# Patient Record
Sex: Female | Born: 1945 | Race: Black or African American | Hispanic: No
Health system: Southern US, Community
[De-identification: ages and names within clinical notes are randomized; demographics above are authoritative.]

## PROBLEM LIST (undated history)

## (undated) HISTORY — PX: BREAST CYST EXCISION: SHX579

---

## 2011-12-02 ENCOUNTER — Other Ambulatory Visit: Payer: Self-pay | Admitting: Family Medicine

## 2011-12-02 DIAGNOSIS — Z1231 Encounter for screening mammogram for malignant neoplasm of breast: Secondary | ICD-10-CM

## 2011-12-02 DIAGNOSIS — Z780 Asymptomatic menopausal state: Secondary | ICD-10-CM

## 2012-03-06 ENCOUNTER — Ambulatory Visit (HOSPITAL_COMMUNITY): Payer: Self-pay

## 2012-04-06 ENCOUNTER — Ambulatory Visit (HOSPITAL_COMMUNITY): Payer: Self-pay

## 2013-04-09 ENCOUNTER — Other Ambulatory Visit (HOSPITAL_COMMUNITY): Payer: Self-pay | Admitting: Family Medicine

## 2013-08-02 ENCOUNTER — Other Ambulatory Visit (HOSPITAL_COMMUNITY): Payer: Self-pay | Admitting: Family Medicine

## 2013-08-02 DIAGNOSIS — Z1231 Encounter for screening mammogram for malignant neoplasm of breast: Secondary | ICD-10-CM

## 2013-09-12 ENCOUNTER — Ambulatory Visit (HOSPITAL_COMMUNITY): Payer: Self-pay

## 2013-10-02 ENCOUNTER — Ambulatory Visit (HOSPITAL_COMMUNITY)
Admission: RE | Admit: 2013-10-02 | Discharge: 2013-10-02 | Disposition: A | Discharge status: Home / Self Care | Payer: Medicare Other | Source: Ambulatory Visit | Attending: Family Medicine | Admitting: Family Medicine

## 2013-10-02 DIAGNOSIS — Z1231 Encounter for screening mammogram for malignant neoplasm of breast: Secondary | ICD-10-CM | POA: Insufficient documentation

## 2014-04-18 ENCOUNTER — Other Ambulatory Visit (HOSPITAL_COMMUNITY): Payer: Self-pay | Admitting: Family Medicine

## 2014-04-18 DIAGNOSIS — E2839 Other primary ovarian failure: Secondary | ICD-10-CM

## 2014-09-08 ENCOUNTER — Other Ambulatory Visit (HOSPITAL_COMMUNITY): Payer: Self-pay | Admitting: Family Medicine

## 2014-09-08 DIAGNOSIS — Z1231 Encounter for screening mammogram for malignant neoplasm of breast: Secondary | ICD-10-CM

## 2014-10-07 ENCOUNTER — Ambulatory Visit (HOSPITAL_COMMUNITY)
Admission: RE | Admit: 2014-10-07 | Discharge: 2014-10-07 | Disposition: A | Discharge status: Home / Self Care | Payer: Medicare Other | Source: Ambulatory Visit | Attending: Family Medicine | Admitting: Family Medicine

## 2014-10-07 DIAGNOSIS — Z1231 Encounter for screening mammogram for malignant neoplasm of breast: Secondary | ICD-10-CM | POA: Insufficient documentation

## 2015-09-07 ENCOUNTER — Other Ambulatory Visit: Payer: Self-pay

## 2015-09-07 DIAGNOSIS — Z1231 Encounter for screening mammogram for malignant neoplasm of breast: Secondary | ICD-10-CM

## 2015-10-14 ENCOUNTER — Ambulatory Visit: Payer: Medicare Other

## 2015-10-27 ENCOUNTER — Ambulatory Visit: Payer: Medicare Other

## 2015-11-03 ENCOUNTER — Ambulatory Visit: Payer: Medicare Other

## 2015-12-01 ENCOUNTER — Ambulatory Visit
Admission: RE | Admit: 2015-12-01 | Discharge: 2015-12-01 | Disposition: A | Discharge status: Home / Self Care | Payer: Medicare Other | Source: Ambulatory Visit

## 2015-12-01 DIAGNOSIS — Z1231 Encounter for screening mammogram for malignant neoplasm of breast: Secondary | ICD-10-CM

## 2016-10-20 ENCOUNTER — Other Ambulatory Visit: Payer: Self-pay | Admitting: Family Medicine

## 2016-10-20 DIAGNOSIS — Z1231 Encounter for screening mammogram for malignant neoplasm of breast: Secondary | ICD-10-CM

## 2016-12-09 ENCOUNTER — Ambulatory Visit: Payer: Medicare Other

## 2016-12-20 ENCOUNTER — Ambulatory Visit
Admission: RE | Admit: 2016-12-20 | Discharge: 2016-12-20 | Disposition: A | Discharge status: Home / Self Care | Payer: Medicare Other | Source: Ambulatory Visit | Attending: Family Medicine | Admitting: Family Medicine

## 2016-12-20 DIAGNOSIS — Z1231 Encounter for screening mammogram for malignant neoplasm of breast: Secondary | ICD-10-CM

## 2017-11-10 ENCOUNTER — Other Ambulatory Visit: Payer: Self-pay | Admitting: Internal Medicine

## 2017-11-10 DIAGNOSIS — Z1231 Encounter for screening mammogram for malignant neoplasm of breast: Secondary | ICD-10-CM

## 2017-12-25 ENCOUNTER — Ambulatory Visit
Admission: RE | Admit: 2017-12-25 | Discharge: 2017-12-25 | Disposition: A | Discharge status: Home / Self Care | Payer: Medicare Other | Source: Ambulatory Visit | Attending: Internal Medicine | Admitting: Internal Medicine

## 2017-12-25 DIAGNOSIS — Z1231 Encounter for screening mammogram for malignant neoplasm of breast: Secondary | ICD-10-CM

## 2018-11-23 ENCOUNTER — Other Ambulatory Visit: Payer: Self-pay | Admitting: Family Medicine

## 2018-11-23 DIAGNOSIS — Z1231 Encounter for screening mammogram for malignant neoplasm of breast: Secondary | ICD-10-CM

## 2019-01-07 ENCOUNTER — Ambulatory Visit
Admission: RE | Admit: 2019-01-07 | Discharge: 2019-01-07 | Disposition: A | Discharge status: Home / Self Care | Payer: Medicare Other | Source: Ambulatory Visit | Attending: Family Medicine | Admitting: Family Medicine

## 2019-01-07 ENCOUNTER — Other Ambulatory Visit: Payer: Self-pay

## 2019-01-07 DIAGNOSIS — Z1231 Encounter for screening mammogram for malignant neoplasm of breast: Secondary | ICD-10-CM

## 2019-12-02 ENCOUNTER — Other Ambulatory Visit: Payer: Self-pay | Admitting: Family Medicine

## 2019-12-02 DIAGNOSIS — Z1231 Encounter for screening mammogram for malignant neoplasm of breast: Secondary | ICD-10-CM

## 2020-01-09 ENCOUNTER — Ambulatory Visit
Admission: RE | Admit: 2020-01-09 | Discharge: 2020-01-09 | Disposition: A | Discharge status: Home / Self Care | Payer: Medicare Other | Source: Ambulatory Visit | Attending: Family Medicine | Admitting: Family Medicine

## 2020-01-09 ENCOUNTER — Other Ambulatory Visit: Payer: Self-pay

## 2020-01-09 DIAGNOSIS — Z1231 Encounter for screening mammogram for malignant neoplasm of breast: Secondary | ICD-10-CM

## 2020-08-22 LAB — EXTERNAL GENERIC LAB PROCEDURE: COLOGUARD: POSITIVE — AB

## 2020-11-19 ENCOUNTER — Other Ambulatory Visit: Payer: Self-pay | Admitting: Family Medicine

## 2020-11-19 DIAGNOSIS — Z1231 Encounter for screening mammogram for malignant neoplasm of breast: Secondary | ICD-10-CM

## 2020-11-19 DIAGNOSIS — Z780 Asymptomatic menopausal state: Secondary | ICD-10-CM

## 2021-02-04 ENCOUNTER — Ambulatory Visit
Admission: RE | Admit: 2021-02-04 | Discharge: 2021-02-04 | Disposition: A | Discharge status: Home / Self Care | Payer: Medicare Other | Source: Ambulatory Visit | Attending: Family Medicine | Admitting: Family Medicine

## 2021-02-04 ENCOUNTER — Other Ambulatory Visit: Payer: Self-pay

## 2021-02-04 DIAGNOSIS — Z1231 Encounter for screening mammogram for malignant neoplasm of breast: Secondary | ICD-10-CM

## 2021-06-08 ENCOUNTER — Ambulatory Visit
Admission: RE | Admit: 2021-06-08 | Discharge: 2021-06-08 | Disposition: A | Discharge status: Home / Self Care | Payer: Medicare Other | Source: Ambulatory Visit | Attending: Family Medicine | Admitting: Family Medicine

## 2021-06-08 DIAGNOSIS — Z780 Asymptomatic menopausal state: Secondary | ICD-10-CM

## 2021-11-26 IMAGING — MG MM DIGITAL SCREENING BILAT W/ TOMO AND CAD
8 of 14 series · 8 of 40 positions shown · non-contrast
Comparison: Previous exam(s).

ACR Breast Density Category a: The breast tissue is almost entirely
fatty.

CLINICAL DATA: Screening.

EXAM:
DIGITAL SCREENING BILATERAL MAMMOGRAM WITH TOMOSYNTHESIS AND CAD
TECHNIQUE: Bilateral screening digital craniocaudal and mediolateral oblique
mammograms were obtained. Bilateral screening digital breast
tomosynthesis was performed. The images were evaluated with
computer-aided detection.

[R MLO synth-2D (1 of 2)]
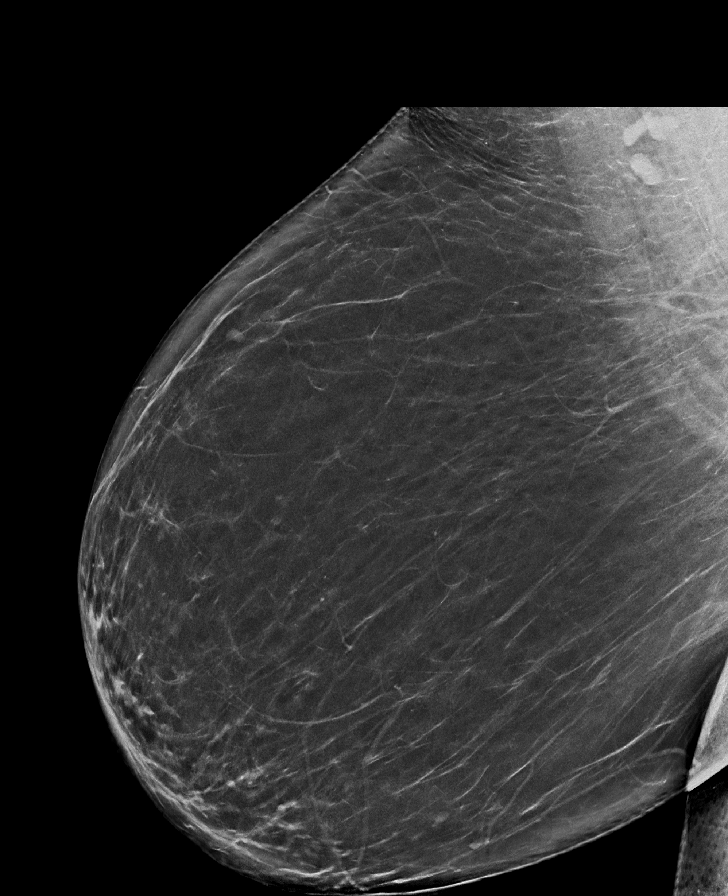

[R MLO synth-2D (2 of 2)]
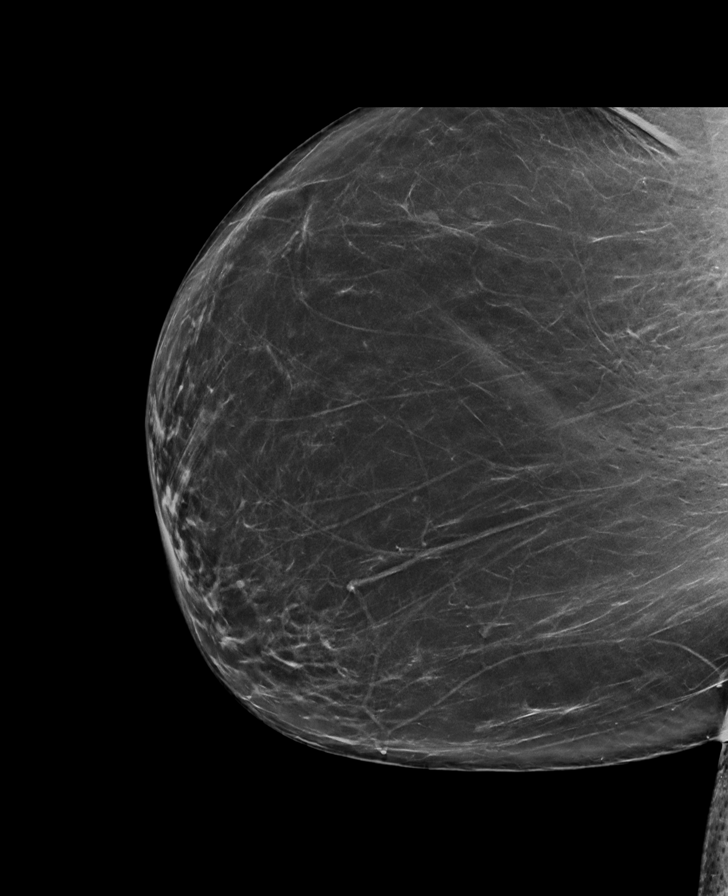

[L MLO synth-2D (1 of 2)]
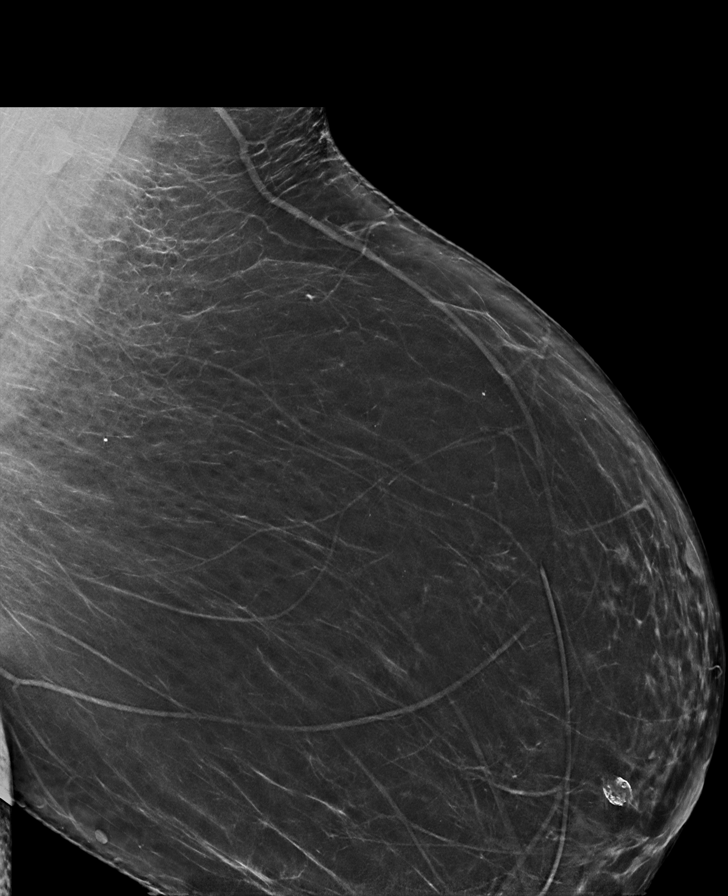

[L CC synth-2D (1 of 2)]
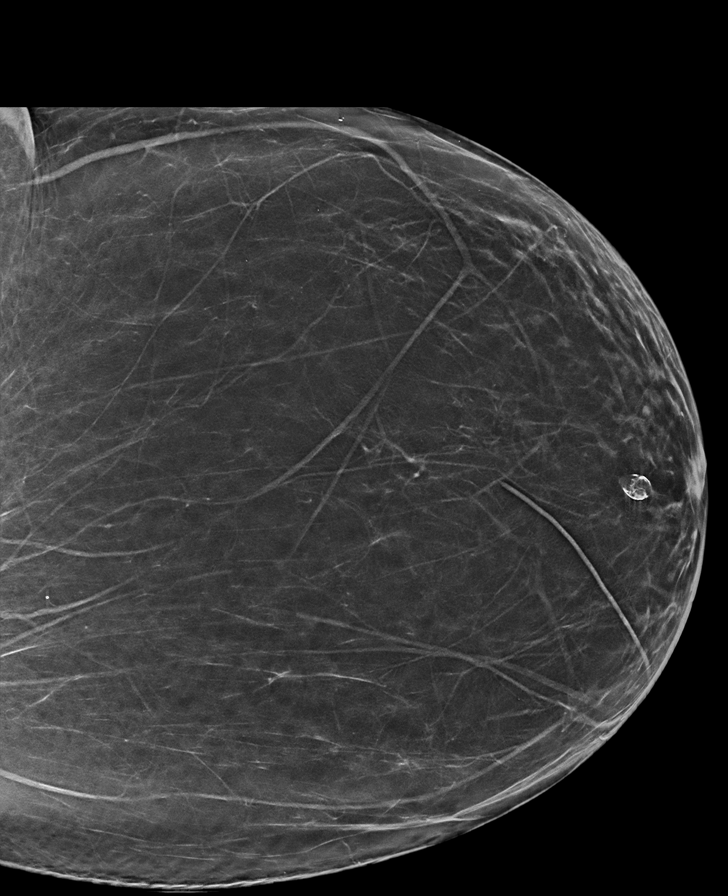

[L MLO synth-2D (2 of 2)]
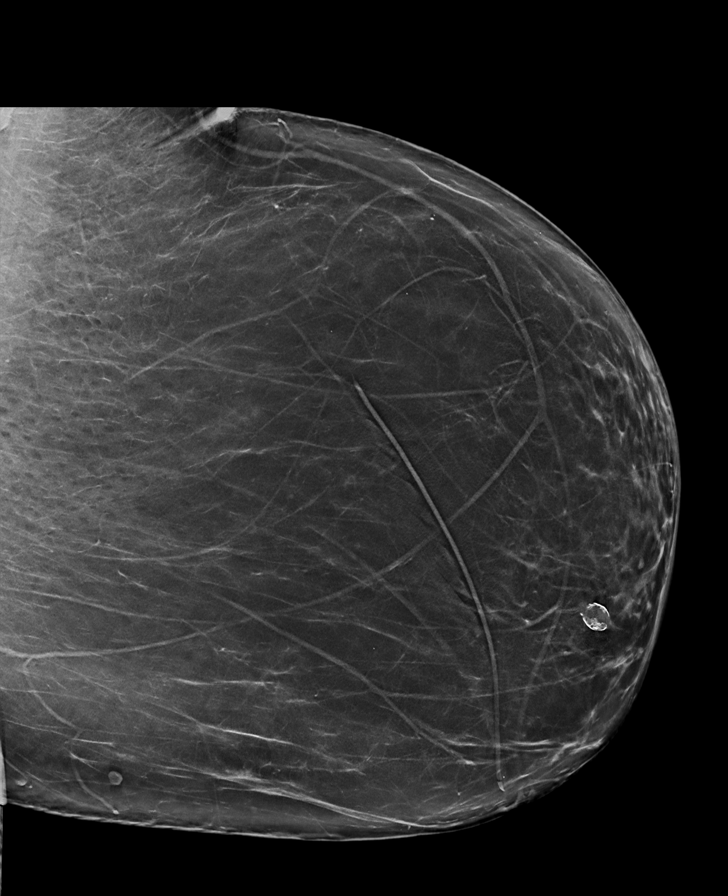

[R CC synth-2D]
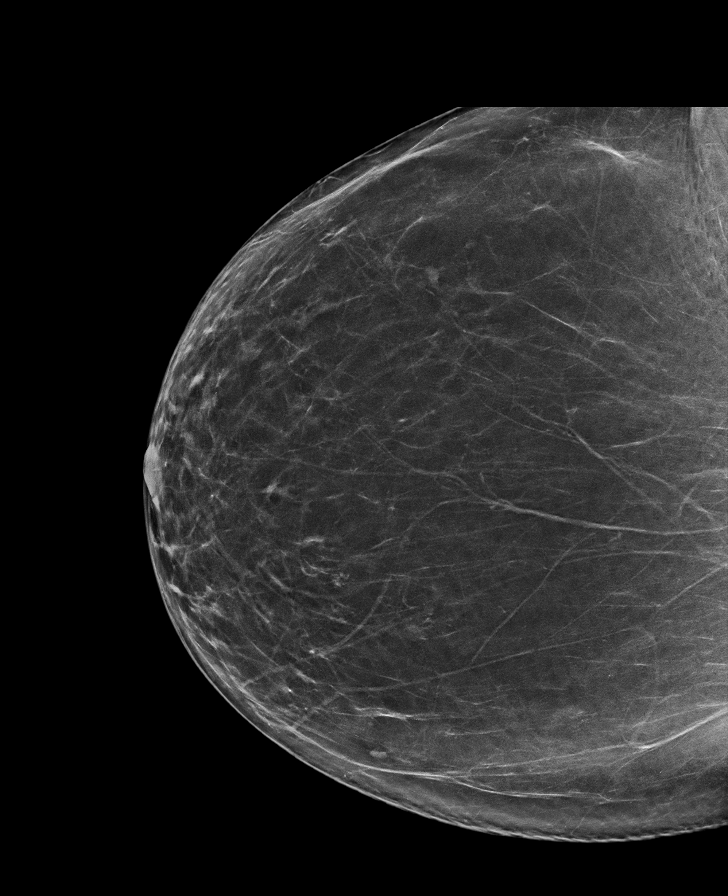

[L CC synth-2D (2 of 2)]
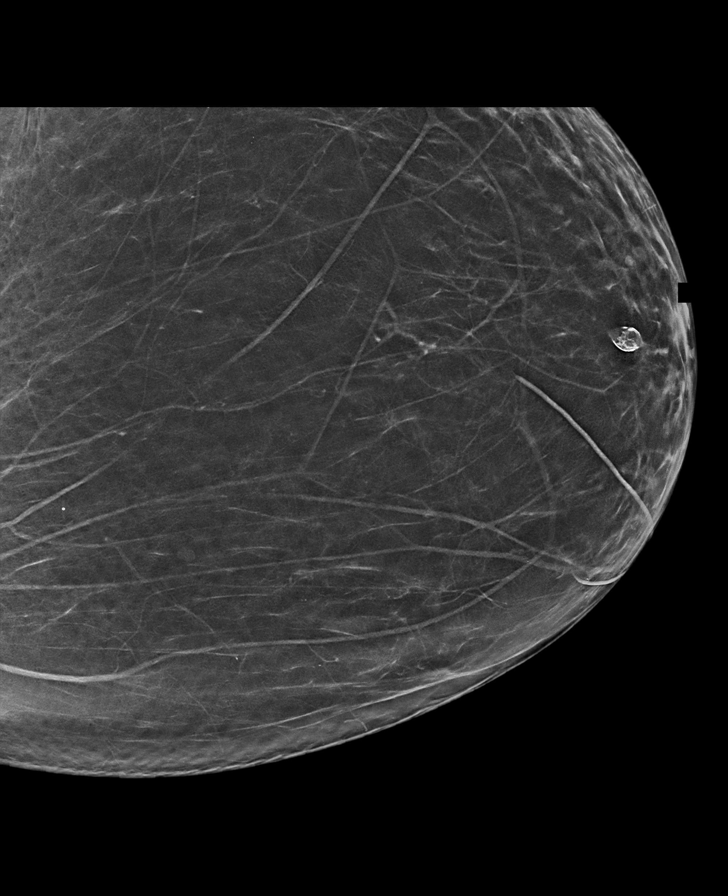

[L MLO tomo · tomo slice 43/84.0]
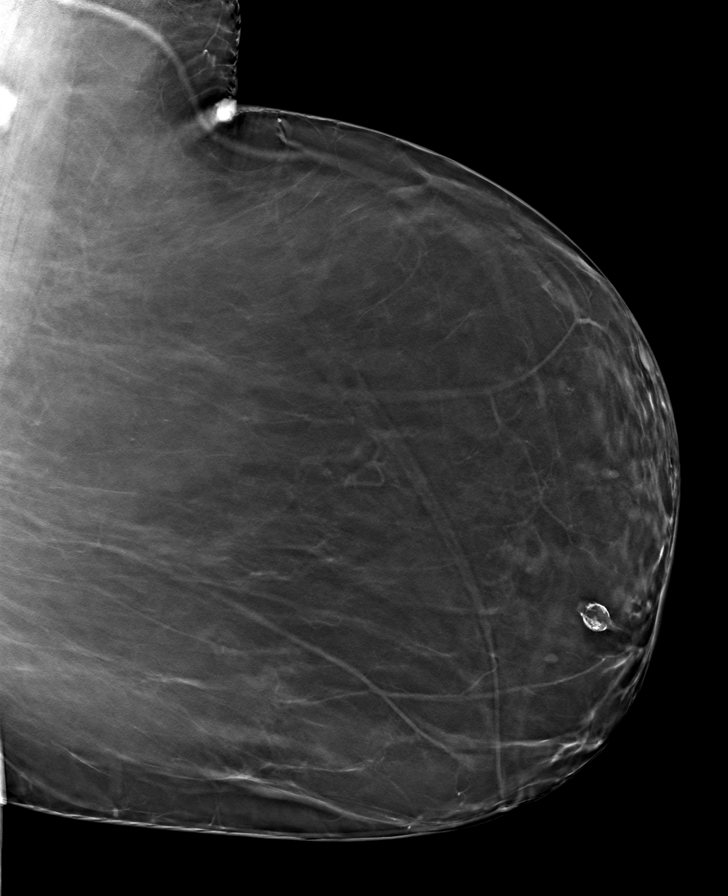

[8 of 40 positions shown; findings below may reference images not displayed]

FINDINGS: There are no findings suspicious for malignancy.
IMPRESSION: No mammographic evidence of malignancy. A result letter of this
screening mammogram will be mailed directly to the patient.

RECOMMENDATION:
Screening mammogram in one year. (Code:0E-3-N98)

BI-RADS CATEGORY  1: Negative.
